# Patient Record
Sex: Female | Born: 1990 | Race: White | Hispanic: No | Marital: Married | Smoking: Never smoker
Health system: Southern US, Community
[De-identification: ages and names within clinical notes are randomized; demographics above are authoritative.]

## PROBLEM LIST (undated history)

## (undated) DIAGNOSIS — Z3483 Encounter for supervision of other normal pregnancy, third trimester: Secondary | ICD-10-CM

## (undated) DIAGNOSIS — Z789 Other specified health status: Secondary | ICD-10-CM

## (undated) HISTORY — DX: Other specified health status: Z78.9

## (undated) HISTORY — PX: WISDOM TOOTH EXTRACTION: SHX21

## (undated) HISTORY — PX: NO PAST SURGERIES: SHX2092

---
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
## 2010-01-30 ENCOUNTER — Other Ambulatory Visit
Admission: RE | Admit: 2010-01-30 | Discharge: 2010-01-30 | Payer: Self-pay | Source: Home / Self Care | Admitting: Family Medicine

## 2012-02-24 ENCOUNTER — Other Ambulatory Visit (HOSPITAL_COMMUNITY)
Admission: RE | Admit: 2012-02-24 | Discharge: 2012-02-24 | Disposition: A | Discharge status: Home / Self Care | Payer: 59 | Source: Ambulatory Visit | Attending: Family Medicine | Admitting: Family Medicine

## 2012-02-24 DIAGNOSIS — Z01419 Encounter for gynecological examination (general) (routine) without abnormal findings: Secondary | ICD-10-CM | POA: Insufficient documentation

## 2012-02-24 DIAGNOSIS — Z113 Encounter for screening for infections with a predominantly sexual mode of transmission: Secondary | ICD-10-CM | POA: Insufficient documentation

## 2015-05-09 ENCOUNTER — Other Ambulatory Visit: Payer: Self-pay | Admitting: Family Medicine

## 2015-05-09 ENCOUNTER — Other Ambulatory Visit (HOSPITAL_COMMUNITY)
Admission: RE | Admit: 2015-05-09 | Discharge: 2015-05-09 | Disposition: A | Discharge status: Home / Self Care | Payer: BLUE CROSS/BLUE SHIELD | Source: Ambulatory Visit | Attending: Family Medicine | Admitting: Family Medicine

## 2015-05-09 DIAGNOSIS — Z01419 Encounter for gynecological examination (general) (routine) without abnormal findings: Secondary | ICD-10-CM | POA: Diagnosis present

## 2015-05-10 LAB — CYTOLOGY - PAP

## 2016-09-03 LAB — OB RESULTS CONSOLE ABO/RH: RH Type: POSITIVE

## 2016-09-03 LAB — OB RESULTS CONSOLE ANTIBODY SCREEN: ANTIBODY SCREEN: NEGATIVE

## 2016-09-03 LAB — OB RESULTS CONSOLE RPR: RPR: NONREACTIVE

## 2016-09-03 LAB — OB RESULTS CONSOLE RUBELLA ANTIBODY, IGM: RUBELLA: NON-IMMUNE/NOT IMMUNE

## 2016-09-03 LAB — OB RESULTS CONSOLE HIV ANTIBODY (ROUTINE TESTING): HIV: NONREACTIVE

## 2016-09-03 LAB — OB RESULTS CONSOLE GC/CHLAMYDIA
CHLAMYDIA, DNA PROBE: NEGATIVE
GC PROBE AMP, GENITAL: NEGATIVE

## 2016-09-03 LAB — OB RESULTS CONSOLE HEPATITIS B SURFACE ANTIGEN: Hepatitis B Surface Ag: NEGATIVE

## 2017-03-04 LAB — OB RESULTS CONSOLE GBS: STREP GROUP B AG: NEGATIVE

## 2017-03-25 ENCOUNTER — Inpatient Hospital Stay (HOSPITAL_COMMUNITY): Admission: AD | Admit: 2017-03-25 | Payer: Self-pay | Source: Ambulatory Visit | Admitting: Obstetrics and Gynecology

## 2017-03-25 NOTE — L&D Delivery Note (Signed)
Delivery Note Pt pushed very well for 30-945min for delivery,  At 9:46 AM a viable and healthy female was delivered via  (Presentation: LOA; LOT  ).  APGAR: 9, 9; weight P .   Placenta status: delivered, intact .  Cord: 3V with the following complications: none.    Anesthesia:  epidural Episiotomy: None Lacerations: 2nd degree;Perineal Suture Repair: 3.0 vicryl rapide Est. Blood Loss (mL): 150cc  Mom to postpartum.  Baby to Couplet care / Skin to Skin.  Teresa Santana 04/04/2017, 10:07 AM  RI/A+/RI/Tdap in PNC/Contra ?

## 2017-03-28 ENCOUNTER — Telehealth (HOSPITAL_COMMUNITY): Payer: Self-pay | Admitting: *Deleted

## 2017-03-28 ENCOUNTER — Encounter (HOSPITAL_COMMUNITY): Payer: Self-pay | Admitting: *Deleted

## 2017-03-28 NOTE — Telephone Encounter (Signed)
Preadmission screen  

## 2017-04-03 ENCOUNTER — Inpatient Hospital Stay (HOSPITAL_COMMUNITY)
Admission: AD | Admit: 2017-04-03 | Discharge: 2017-04-05 | DRG: 807 | Disposition: A | Discharge status: Home / Self Care | Payer: BLUE CROSS/BLUE SHIELD | Source: Ambulatory Visit | Attending: Obstetrics and Gynecology | Admitting: Obstetrics and Gynecology

## 2017-04-03 DIAGNOSIS — Z3A40 40 weeks gestation of pregnancy: Secondary | ICD-10-CM

## 2017-04-04 ENCOUNTER — Inpatient Hospital Stay (HOSPITAL_COMMUNITY): Payer: BLUE CROSS/BLUE SHIELD | Admitting: Anesthesiology

## 2017-04-04 ENCOUNTER — Encounter (HOSPITAL_COMMUNITY): Payer: Self-pay | Admitting: *Deleted

## 2017-04-04 ENCOUNTER — Other Ambulatory Visit: Payer: Self-pay

## 2017-04-04 DIAGNOSIS — Z3A40 40 weeks gestation of pregnancy: Secondary | ICD-10-CM | POA: Diagnosis not present

## 2017-04-04 DIAGNOSIS — Z3483 Encounter for supervision of other normal pregnancy, third trimester: Secondary | ICD-10-CM | POA: Diagnosis present

## 2017-04-04 LAB — CBC
HCT: 33 % — ABNORMAL LOW (ref 36.0–46.0)
HEMOGLOBIN: 10.8 g/dL — AB (ref 12.0–15.0)
MCH: 26.7 pg (ref 26.0–34.0)
MCHC: 32.7 g/dL (ref 30.0–36.0)
MCV: 81.5 fL (ref 78.0–100.0)
PLATELETS: 269 10*3/uL (ref 150–400)
RBC: 4.05 MIL/uL (ref 3.87–5.11)
RDW: 14 % (ref 11.5–15.5)
WBC: 14.6 10*3/uL — ABNORMAL HIGH (ref 4.0–10.5)

## 2017-04-04 LAB — TYPE AND SCREEN
ABO/RH(D): A POS
ANTIBODY SCREEN: NEGATIVE

## 2017-04-04 LAB — ABO/RH: ABO/RH(D): A POS

## 2017-04-04 LAB — RPR: RPR Ser Ql: NONREACTIVE

## 2017-04-04 MED ORDER — TERBUTALINE SULFATE 1 MG/ML IJ SOLN
0.2500 mg | Freq: Once | INTRAMUSCULAR | Status: DC | PRN
  Filled 2017-04-04: qty 1

## 2017-04-04 MED ORDER — IBUPROFEN 600 MG PO TABS
600.0000 mg | ORAL_TABLET | Freq: Four times a day (QID) | ORAL | Status: DC
  Administered 2017-04-04 – 2017-04-05 (×4): 600 mg via ORAL
  Filled 2017-04-04 (×4): qty 1

## 2017-04-04 MED ORDER — OXYCODONE-ACETAMINOPHEN 5-325 MG PO TABS: 2.0000 | ORAL_TABLET | ORAL | Status: DC | PRN

## 2017-04-04 MED ORDER — EPHEDRINE 5 MG/ML INJ
10.0000 mg | INTRAVENOUS | Status: DC | PRN
  Filled 2017-04-04: qty 2

## 2017-04-04 MED ORDER — PHENYLEPHRINE 40 MCG/ML (10ML) SYRINGE FOR IV PUSH (FOR BLOOD PRESSURE SUPPORT)
80.0000 ug | PREFILLED_SYRINGE | INTRAVENOUS | Status: DC | PRN
  Filled 2017-04-04: qty 10
  Filled 2017-04-04: qty 5

## 2017-04-04 MED ORDER — ZOLPIDEM TARTRATE 5 MG PO TABS: 5.0000 mg | ORAL_TABLET | Freq: Every evening | ORAL | Status: DC | PRN

## 2017-04-04 MED ORDER — OXYTOCIN 40 UNITS IN LACTATED RINGERS INFUSION - SIMPLE MED
1.0000 m[IU]/min | INTRAVENOUS | Status: DC
  Administered 2017-04-04: 2 m[IU]/min via INTRAVENOUS
  Filled 2017-04-04: qty 1000

## 2017-04-04 MED ORDER — SENNOSIDES-DOCUSATE SODIUM 8.6-50 MG PO TABS
2.0000 | ORAL_TABLET | ORAL | Status: DC
  Administered 2017-04-05: 2 via ORAL
  Filled 2017-04-04: qty 2

## 2017-04-04 MED ORDER — LACTATED RINGERS IV SOLN: INTRAVENOUS | Status: DC

## 2017-04-04 MED ORDER — OXYCODONE-ACETAMINOPHEN 5-325 MG PO TABS: 1.0000 | ORAL_TABLET | ORAL | Status: DC | PRN

## 2017-04-04 MED ORDER — LIDOCAINE HCL (PF) 1 % IJ SOLN
INTRAMUSCULAR | Status: DC | PRN
  Administered 2017-04-04: 4 mL via EPIDURAL
  Administered 2017-04-04: 8 mL via EPIDURAL

## 2017-04-04 MED ORDER — FLEET ENEMA 7-19 GM/118ML RE ENEM: 1.0000 | ENEMA | RECTAL | Status: DC | PRN

## 2017-04-04 MED ORDER — COCONUT OIL OIL: 1.0000 "application " | TOPICAL_OIL | Status: DC | PRN

## 2017-04-04 MED ORDER — DIPHENHYDRAMINE HCL 25 MG PO CAPS: 25.0000 mg | ORAL_CAPSULE | Freq: Four times a day (QID) | ORAL | Status: DC | PRN

## 2017-04-04 MED ORDER — LACTATED RINGERS IV SOLN
500.0000 mL | Freq: Once | INTRAVENOUS | Status: AC
  Administered 2017-04-04: 500 mL via INTRAVENOUS

## 2017-04-04 MED ORDER — ONDANSETRON HCL 4 MG PO TABS: 4.0000 mg | ORAL_TABLET | ORAL | Status: DC | PRN

## 2017-04-04 MED ORDER — DIBUCAINE 1 % RE OINT: 1.0000 "application " | TOPICAL_OINTMENT | RECTAL | Status: DC | PRN

## 2017-04-04 MED ORDER — ACETAMINOPHEN 325 MG PO TABS: 650.0000 mg | ORAL_TABLET | ORAL | Status: DC | PRN

## 2017-04-04 MED ORDER — LACTATED RINGERS IV SOLN
INTRAVENOUS | Status: DC
  Administered 2017-04-04 (×2): via INTRAVENOUS

## 2017-04-04 MED ORDER — DIPHENHYDRAMINE HCL 50 MG/ML IJ SOLN: 12.5000 mg | INTRAMUSCULAR | Status: DC | PRN

## 2017-04-04 MED ORDER — OXYTOCIN BOLUS FROM INFUSION
500.0000 mL | Freq: Once | INTRAVENOUS | Status: AC
  Administered 2017-04-04: 500 mL via INTRAVENOUS

## 2017-04-04 MED ORDER — BUTORPHANOL TARTRATE 1 MG/ML IJ SOLN
1.0000 mg | INTRAMUSCULAR | Status: DC | PRN
  Administered 2017-04-04: 1 mg via INTRAVENOUS
  Filled 2017-04-04: qty 1

## 2017-04-04 MED ORDER — ONDANSETRON HCL 4 MG/2ML IJ SOLN
4.0000 mg | INTRAMUSCULAR | Status: DC | PRN
Start: 2017-04-04 — End: 2017-04-05

## 2017-04-04 MED ORDER — BENZOCAINE-MENTHOL 20-0.5 % EX AERO
1.0000 "application " | INHALATION_SPRAY | CUTANEOUS | Status: DC | PRN
  Administered 2017-04-04: 1 via TOPICAL
  Filled 2017-04-04: qty 56

## 2017-04-04 MED ORDER — PHENYLEPHRINE 40 MCG/ML (10ML) SYRINGE FOR IV PUSH (FOR BLOOD PRESSURE SUPPORT)
80.0000 ug | PREFILLED_SYRINGE | INTRAVENOUS | Status: DC | PRN
  Filled 2017-04-04: qty 5

## 2017-04-04 MED ORDER — LIDOCAINE HCL (PF) 1 % IJ SOLN
30.0000 mL | INTRAMUSCULAR | Status: DC | PRN
  Filled 2017-04-04: qty 30

## 2017-04-04 MED ORDER — ONDANSETRON HCL 4 MG/2ML IJ SOLN: 4.0000 mg | Freq: Four times a day (QID) | INTRAMUSCULAR | Status: DC | PRN

## 2017-04-04 MED ORDER — LACTATED RINGERS IV SOLN: 500.0000 mL | INTRAVENOUS | Status: DC | PRN

## 2017-04-04 MED ORDER — FENTANYL 2.5 MCG/ML BUPIVACAINE 1/10 % EPIDURAL INFUSION (WH - ANES)
14.0000 mL/h | INTRAMUSCULAR | Status: DC | PRN
  Administered 2017-04-04: 14 mL/h via EPIDURAL
  Filled 2017-04-04: qty 100

## 2017-04-04 MED ORDER — WITCH HAZEL-GLYCERIN EX PADS: 1.0000 "application " | MEDICATED_PAD | CUTANEOUS | Status: DC | PRN

## 2017-04-04 MED ORDER — SOD CITRATE-CITRIC ACID 500-334 MG/5ML PO SOLN: 30.0000 mL | ORAL | Status: DC | PRN

## 2017-04-04 MED ORDER — OXYTOCIN 40 UNITS IN LACTATED RINGERS INFUSION - SIMPLE MED: 2.5000 [IU]/h | INTRAVENOUS | Status: DC

## 2017-04-04 MED ORDER — PRENATAL MULTIVITAMIN CH
1.0000 | ORAL_TABLET | Freq: Every day | ORAL | Status: DC
  Administered 2017-04-05: 1 via ORAL
  Filled 2017-04-04: qty 1

## 2017-04-04 MED ORDER — SIMETHICONE 80 MG PO CHEW
80.0000 mg | CHEWABLE_TABLET | ORAL | Status: DC | PRN
Start: 2017-04-04 — End: 2017-04-05

## 2017-04-04 NOTE — Anesthesia Preprocedure Evaluation (Signed)

## 2017-04-04 NOTE — Anesthesia Postprocedure Evaluation (Signed)
Anesthesia Post Note  Patient: Arnetha GulaKatherine L Macek  Procedure(s) Performed: AN AD HOC LABOR EPIDURAL     Patient location during evaluation: Mother Baby Anesthesia Type: Epidural Level of consciousness: awake and alert Pain management: pain level controlled Vital Signs Assessment: post-procedure vital signs reviewed and stable Respiratory status: spontaneous breathing, nonlabored ventilation and respiratory function stable Cardiovascular status: stable Postop Assessment: no headache, no backache, epidural receding, no apparent nausea or vomiting, patient able to bend at knees and adequate PO intake Anesthetic complications: no    Last Vitals:  Vitals:   04/04/17 1130 04/04/17 1236  BP: (!) 107/57 (!) 112/50  Pulse:    Resp: 18   Temp: 36.7 C 36.8 C  SpO2:      Last Pain:  Vitals:   04/04/17 1236  TempSrc: Axillary  PainSc:    Pain Goal: Patients Stated Pain Goal: 2 (04/04/17 0436)               Laban EmperorMalinova,Eaden Hettinger Hristova

## 2017-04-04 NOTE — MAU Note (Signed)
Dr. Jackelyn KnifeMeisinger wants to watch her for one more hour if she feels up to it & recheck her at the end of that hour.

## 2017-04-04 NOTE — MAU Note (Addendum)
Took report from Caprice Renshawebra Callaway at this time. Pt just left to ambulate.

## 2017-04-04 NOTE — MAU Note (Signed)
PT SAYS UC STRONG SINCE 9PM.  VE IN OFFICE YESTERDAY 1 CM.   DENIES HSV AND MRSA.    GBS- NEG

## 2017-04-04 NOTE — Anesthesia Pain Management Evaluation Note (Signed)
  CRNA Pain Management Visit Note  Patient: Teresa Santana, 27 y.o., female  "Hello I am a [facility] the anesthesia [facility] at [facility]. We have an anesthesia team available at all times to provide care throughout the hospital, including epidural management and anesthesia for C-section. I don't know your plan for the delivery whether it a natural birth, water birth, IV sedation, nitrous supplementation, doula or epidural, but we want to meet your pain goals."   1.Was your pain managed to your expectations on prior hospitalizations?   No prior hospitalizations  2.What is your expectation for pain management during this hospitalization?     Epidural  3.How can we help you reach that goal?   Record the patient's initial score and the patient's pain goal.   Pain: 0  Pain Goal: 4 [facility] wants you to be able to say your pain was always managed very well.  Laban EmperorMalinova,Teresa Santana 04/04/2017

## 2017-04-04 NOTE — Anesthesia Procedure Notes (Signed)
Epidural Patient location during procedure: OB Start time: 04/04/2017 5:16 AM End time: 04/04/2017 5:19 AM  Staffing Anesthesiologist: Beryle LatheBrock, Thomas E, MD Performed: anesthesiologist   Preanesthetic Checklist Completed: patient identified, pre-op evaluation, timeout performed, IV checked, risks and benefits discussed and monitors and equipment checked  Epidural Patient position: sitting Prep: DuraPrep Patient monitoring: continuous pulse ox and blood pressure Approach: midline Location: L2-L3 Injection technique: LOR saline  Needle:  Needle type: Tuohy  Needle gauge: 17 G Needle length: 9 cm Needle insertion depth: 4 cm Catheter size: 19 Gauge Catheter at skin depth: 10 cm Test dose: negative and Other (1% lidocaine)  Additional Notes Patient identified. Risks including, but not limited to, bleeding, infection, nerve damage, paralysis, inadequate analgesia, blood pressure changes, nausea, vomiting, allergic reaction, postpartum back pain, itching, and headache were discussed. Patient expressed understanding and wished to proceed. Sterile prep and drape, including hand hygiene, mask, and sterile gloves were used. The patient was positioned and the spine was prepped. The skin was anesthetized with lidocaine. No paraesthesia or other complication noted. The patient did not experience any signs of intravascular injection such as tinnitus or metallic taste in mouth, nor signs of intrathecal spread such as rapid motor block. Please see nursing notes for vital signs. The patient tolerated the procedure well.   Leslye Peerhomas Brock, MDReason for block:procedure for pain

## 2017-04-04 NOTE — H&P (Signed)
Teresa Santana is a 27 y.o. female G1P0 at 44+ who presents in early labor.  Cervical change documented in MAU.  Relatively uncomplicated PNC.  Pt declined genetic screening.  [place] for S<D, but good growth.  Group B negative.     OB History    Gravida Para Term Preterm AB Living   1             SAB TAB Ectopic Multiple Live Births                no abn pap, no STDs G1 present  Past Medical History:  Diagnosis Date  . Medical history non-contributory    Past Surgical History:  Procedure Laterality Date  . NO PAST SURGERIES    . WISDOM TOOTH EXTRACTION     Family History: family history includes Cancer in her paternal grandmother.(lung cancer, leukemia) Social History:  reports that  has never smoked. she has never used smokeless tobacco. She reports that she does not drink alcohol or use drugs.married, works at [employer] PNV All NKDA     Maternal Diabetes: No Genetic Screening: Declined Maternal Ultrasounds/Referrals: Normal Fetal Ultrasounds or other Referrals:  None Maternal Substance Abuse:  No Significant Maternal Medications:  None Significant Maternal Lab Results:  Lab values include: Group B Strep negative Other Comments:  None  Review of Systems  Constitutional: Negative.   Eyes: Negative.   Respiratory: Negative.   Cardiovascular: Negative.   Gastrointestinal: Negative.   Genitourinary: Negative.   Musculoskeletal: Positive for back pain.  Skin: Negative.   Neurological: Negative.   Psychiatric/Behavioral: Negative.    Maternal Medical History:  Reason for admission: Contractions.   Contractions: Onset was 6-12 hours ago.   Frequency: regular.   Perceived severity is moderate.    Fetal activity: Perceived fetal activity is normal.    Prenatal Complications - Diabetes: none.    Dilation: 6.5 Effacement (%): 90 Station: 0 Exam by:: e. poore, rnc Blood pressure (!) 113/57, pulse 70, temperature (!) 97.3 F (36.3 C),  temperature source Oral, resp. rate 20, height 5\' 7"  (1.702 m), weight 75.5 kg (166 lb 8 oz), last menstrual period 06/27/2016, SpO2 100 %. Maternal Exam:  Uterine Assessment: Contraction frequency is regular.   Abdomen: Patient reports no abdominal tenderness. Fundal height is app for gestation.   Estimated fetal weight is 7-8#.   Fetal presentation: vertex  Introitus: Normal vulva. Normal vagina.  Cervix: Cervix evaluated by digital exam.     Physical Exam  Constitutional: She is oriented to person, place, and time. She appears well-developed and well-nourished.  HENT:  Head: Normocephalic and atraumatic.  Cardiovascular: Normal rate and regular rhythm.  Respiratory: Effort normal and breath sounds normal. No respiratory distress. She has no wheezes.  GI: Soft. Bowel sounds are normal. She exhibits no distension. There is no tenderness.  Musculoskeletal: Normal range of motion.  Neurological: She is alert and oriented to person, place, and time.  Skin: Skin is warm and dry.  Psychiatric: She has a normal mood and affect. Her behavior is normal.    Prenatal labs: ABO, Rh: --/--/A POS (01/11 0427) Antibody: NEG (01/11 0427) Rubella: Nonimmune (06/12 0000) RPR: Nonreactive (06/12 0000)  HBsAg: Negative (06/12 0000)  HIV: Non-reactive (06/12 0000)  GBS: Negative (12/11 0000)   Hgb 12.9/Plt 356/GC  Neg/Chl neg/Varicella immune/Hgb electro WNL/US dates cwd from LMP/  Nl anat, ant plac, female Good growth  Assessment/Plan: 26yo G1P0 at 34+ in labor, getting gactive Expect  SVD Epidural for comfort gbbs neg Augmentation prn.     Alhaji Mcneal Bovard-Stuckert 04/04/2017, 8:00 AM

## 2017-04-05 ENCOUNTER — Inpatient Hospital Stay (HOSPITAL_COMMUNITY): Admission: RE | Admit: 2017-04-05 | Payer: BLUE CROSS/BLUE SHIELD | Source: Ambulatory Visit

## 2017-04-05 LAB — CBC
HCT: 28.8 % — ABNORMAL LOW (ref 36.0–46.0)
HEMOGLOBIN: 9.4 g/dL — AB (ref 12.0–15.0)
MCH: 26.9 pg (ref 26.0–34.0)
MCHC: 32.6 g/dL (ref 30.0–36.0)
MCV: 82.5 fL (ref 78.0–100.0)
PLATELETS: 223 10*3/uL (ref 150–400)
RBC: 3.49 MIL/uL — AB (ref 3.87–5.11)
RDW: 14.2 % (ref 11.5–15.5)
WBC: 12 10*3/uL — ABNORMAL HIGH (ref 4.0–10.5)

## 2017-04-05 MED ORDER — IBUPROFEN 600 MG PO TABS: 600.0000 mg | ORAL_TABLET | Freq: Four times a day (QID) | ORAL | 1 refills | Status: DC | PRN

## 2017-04-05 MED ORDER — PRENATAL MULTIVITAMIN CH: 1.0000 | ORAL_TABLET | Freq: Every day | ORAL | 3 refills | Status: AC

## 2017-04-05 MED ORDER — MEASLES, MUMPS & RUBELLA VAC ~~LOC~~ INJ
0.5000 mL | INJECTION | Freq: Once | SUBCUTANEOUS | Status: AC
  Administered 2017-04-05: 0.5 mL via SUBCUTANEOUS
  Filled 2017-04-05: qty 0.5

## 2017-04-05 NOTE — Progress Notes (Addendum)
Post Partum Day 1 Subjective: no complaints, up ad lib, voiding, tolerating PO and nl lochia, pain controoled  Objective: Blood pressure (!) 109/51, pulse 62, temperature 98.3 F (36.8 C), temperature source Oral, resp. rate 18, height 5\' 7"  (1.702 m), weight 75.5 kg (166 lb 8 oz), last menstrual period 06/27/2016, SpO2 100 %, unknown if currently breastfeeding.  Physical Exam:  General: alert and no distress Lochia: appropriate Uterine Fundus: firm   Recent Labs    04/04/17 0427 04/05/17 0532  HGB 10.8* 9.4*  HCT 33.0* 28.8*    Assessment/Plan: Plan for discharge tomorrow, routine PP care.  Pt requests early discharge.  D/C with motrin and PNV.  F/u 6 weeks   LOS: 1 day   Tomicka Lover Bovard-Stuckert 04/05/2017, 8:18 AM

## 2017-04-05 NOTE — Discharge Summary (Signed)
OB Discharge Summary     Patient Name: Teresa Santana DOB: 06/03/1990 MRN: [id number]  Date of admission: 04/03/2017 Delivering MD: Sherian Rein   Date of discharge: 04/05/2017  Admitting diagnosis: 40 WEEKS CTX Intrauterine pregnancy: [redacted]w[redacted]d     Secondary diagnosis:  Principal Problem:   SVD (spontaneous vaginal delivery) Active Problems:   Indication for care in labor or delivery  Additional problems: N/A     Discharge diagnosis: Term Pregnancy Delivered                                                                                                Post partum procedures:N/A  Augmentation: Pitocin  Complications: None  Hospital course:  Onset of Labor With Vaginal Delivery     27 y.o. yo G1P1001 at [redacted]w[redacted]d was admitted in Active Labor on 04/03/2017. Patient had an uncomplicated labor course as follows:  Membrane Rupture Time/Date: 7:16 AM ,04/04/2017   Intrapartum Procedures: Episiotomy: None [1]                                         Lacerations:  2nd degree [3];Perineal [11]  Patient had a delivery of a Viable infant. 04/04/2017  Information for the patient's newborn:  Elexius, Minar [[id number]]  Delivery Method: Vaginal, Spontaneous(Filed from Delivery Summary)    Pateint had an uncomplicated postpartum course.  She is ambulating, tolerating a regular diet, passing flatus, and urinating well. Patient is discharged home in stable condition on 04/05/17.   Physical exam  Vitals:   04/04/17 1130 04/04/17 1236 04/04/17 1705 04/05/17 0059  BP: (!) 107/57 (!) 112/50 (!) 116/46 (!) 109/51  Pulse:   75 62  Resp: 18  20 18   Temp: 98.1 F (36.7 C) 98.3 F (36.8 C) 98.2 F (36.8 C) 98.3 F (36.8 C)  TempSrc: Axillary Axillary Oral Oral  SpO2:      Weight:      Height:       General: alert and no distress Lochia: appropriate Uterine Fundus: firm  Labs: Lab Results  Component Value Date   WBC 12.0 (H) 04/05/2017   HGB 9.4 (L) 04/05/2017   HCT 28.8 (L) 04/05/2017   MCV 82.5 04/05/2017   PLT 223 04/05/2017   No flowsheet data found.  Discharge instruction: per After Visit Summary and "Baby and Me Booklet".  After visit meds:  Allergies as of 04/05/2017   No Known Allergies     Medication List    TAKE these medications   ibuprofen 600 MG tablet Commonly known as:  ADVIL,MOTRIN Take 1 tablet (600 mg total) by mouth every 6 (six) hours as needed.   prenatal multivitamin Tabs tablet Take 1 tablet by mouth daily at 12 noon.       Diet: routine diet  Activity: Advance as tolerated. Pelvic rest for 6 weeks.   Outpatient follow up:6 weeks Follow up Appt:No future appointments. Follow up Visit:No Follow-up on file.  Postpartum contraception: Undecided  Newborn Data: Live born female  Birth  Weight: 7 lb 7.4 oz (3385 g) APGAR: 9, 9  Newborn Delivery   Birth date/time:  04/04/2017 09:46:00 Delivery type:  Vaginal, Spontaneous     Baby Feeding: Breast Disposition:home with mother   04/05/2017 Sherian ReinJody Bovard-Stuckert, MD

## 2017-12-18 DIAGNOSIS — Z23 Encounter for immunization: Secondary | ICD-10-CM | POA: Diagnosis not present

## 2018-03-25 NOTE — L&D Delivery Note (Signed)
Delivery Note With 3-4 excellent pushes had delivery.  At 11:26 AM a viable and healthy female was delivered via Vaginal, Spontaneous (Presentation: OA; ROT ).  APGAR: 9,9 ; weight P .   Placenta status: delivered, intact .  Cord: 3V with the following complications: none.   Anesthesia: epidural  Episiotomy: None Lacerations: 2nd degree;Perineal Suture Repair: 3.0 vicryl rapide Est. Blood Loss (mL): 142  Mom to postpartum.  Baby to Couplet care / Skin to Skin.  Luzelena Heeg Bovard-Stuckert 11/23/2018, 11:50 AM  Bo/RI/Tdap in PNC/A+/Conta?  D/W pt and FOB r/b/a of circumcision for female infant, will proceed

## 2018-04-07 DIAGNOSIS — O26891 Other specified pregnancy related conditions, first trimester: Secondary | ICD-10-CM | POA: Diagnosis not present

## 2018-04-07 DIAGNOSIS — N911 Secondary amenorrhea: Secondary | ICD-10-CM | POA: Diagnosis not present

## 2018-04-07 DIAGNOSIS — Z3A09 9 weeks gestation of pregnancy: Secondary | ICD-10-CM | POA: Diagnosis not present

## 2018-04-07 DIAGNOSIS — Z3201 Encounter for pregnancy test, result positive: Secondary | ICD-10-CM | POA: Diagnosis not present

## 2018-04-29 DIAGNOSIS — Z3481 Encounter for supervision of other normal pregnancy, first trimester: Secondary | ICD-10-CM | POA: Diagnosis not present

## 2018-04-29 DIAGNOSIS — Z3A12 12 weeks gestation of pregnancy: Secondary | ICD-10-CM | POA: Diagnosis not present

## 2018-06-04 DIAGNOSIS — L03032 Cellulitis of left toe: Secondary | ICD-10-CM | POA: Diagnosis not present

## 2018-07-01 DIAGNOSIS — Z3A19 19 weeks gestation of pregnancy: Secondary | ICD-10-CM | POA: Diagnosis not present

## 2018-07-15 DIAGNOSIS — L03032 Cellulitis of left toe: Secondary | ICD-10-CM | POA: Diagnosis not present

## 2018-07-15 DIAGNOSIS — M79674 Pain in right toe(s): Secondary | ICD-10-CM | POA: Diagnosis not present

## 2018-07-15 DIAGNOSIS — M79675 Pain in left toe(s): Secondary | ICD-10-CM | POA: Diagnosis not present

## 2018-07-15 DIAGNOSIS — L600 Ingrowing nail: Secondary | ICD-10-CM | POA: Diagnosis not present

## 2018-07-29 DIAGNOSIS — M79675 Pain in left toe(s): Secondary | ICD-10-CM | POA: Diagnosis not present

## 2018-07-29 DIAGNOSIS — M79674 Pain in right toe(s): Secondary | ICD-10-CM | POA: Diagnosis not present

## 2018-07-29 DIAGNOSIS — L600 Ingrowing nail: Secondary | ICD-10-CM | POA: Diagnosis not present

## 2018-08-05 DIAGNOSIS — Z3A24 24 weeks gestation of pregnancy: Secondary | ICD-10-CM | POA: Diagnosis not present

## 2018-08-13 DIAGNOSIS — L600 Ingrowing nail: Secondary | ICD-10-CM | POA: Diagnosis not present

## 2018-11-22 ENCOUNTER — Encounter (HOSPITAL_COMMUNITY): Payer: Self-pay

## 2018-11-22 ENCOUNTER — Inpatient Hospital Stay (HOSPITAL_COMMUNITY): Payer: 59

## 2018-11-22 ENCOUNTER — Encounter (HOSPITAL_COMMUNITY): Payer: Self-pay | Admitting: Obstetrics and Gynecology

## 2018-11-22 DIAGNOSIS — Z3483 Encounter for supervision of other normal pregnancy, third trimester: Secondary | ICD-10-CM

## 2018-11-22 HISTORY — DX: Encounter for supervision of other normal pregnancy, third trimester: Z34.83

## 2018-11-22 NOTE — H&P (Signed)
Marianna FussKatherine Lorraine Edds is a 28 y.o. female G2P1001 at 39+ for IOL given term status and multiparous.  Pregnancy complicated by fetal pyelectasis, resolved on US.  Pregnancy dated by 7 wk US.  +FM, no LOF, no VB, occ ctx.  Declined genetic screening.  Received tdap 08/25/18.  gbbs neg.  D/W pt r/b/a and process of IOL.  Wishes to proceed.     OB History    Gravida  1   Para  1   Term  1   Preterm      AB      Living  1     SAB      TAB      Ectopic      Multiple  0   Live Births  1         G1 term  SVD, female 1/19, 7#7 G2 present  No abn pap No STD  Past Medical History:  Diagnosis Date  . Medical history non-contributory   . Normal pregnancy in multigravida in third trimester 11/22/2018  . SVD (spontaneous vaginal delivery) 04/04/2017   Past Surgical History:  Procedure Laterality Date  . NO PAST SURGERIES    . WISDOM TOOTH EXTRACTION     Family History: family history includes Cancer in her paternal grandmother.leukemia and ALS Social History:  reports that she has never smoked. She has never used smokeless tobacco. She reports that she does not drink alcohol or use drugs. SAHM, married  Meds PNV All NKDA     Maternal Diabetes: No Genetic Screening: Declined Maternal Ultrasounds/Referrals: Fetal renal pyelectasis, resolved Fetal Ultrasounds or other Referrals:  None Maternal Substance Abuse:  No Significant Maternal Medications:  None Significant Maternal Lab Results:  Group B Strep negative Other Comments:  None  Review of Systems  Constitutional: Negative.   HENT: Negative.   Eyes: Negative.   Respiratory: Negative.   Cardiovascular: Negative.   Gastrointestinal: Negative.   Genitourinary: Negative.   Musculoskeletal: Negative.   Skin: Negative.   Neurological: Negative.   Psychiatric/Behavioral: Negative.    Maternal Medical History:  Contractions: Frequency: irregular.    Fetal activity: Perceived fetal activity is normal.     Prenatal Complications - Diabetes: none.      unknown if currently breastfeeding. Maternal Exam:  Uterine Assessment: Contraction frequency is regular.   Abdomen: Fundal height is appropriate for gestation.   Estimated fetal weight is 7.5-8.5#.   Fetal presentation: vertex  Introitus: Normal vulva. Normal vagina.    Physical Exam  Constitutional: She is oriented to person, place, and time. She appears well-developed and well-nourished.  HENT:  Head: Normocephalic and atraumatic.  Cardiovascular: Normal rate and regular rhythm.  Respiratory: Effort normal and breath sounds normal. No respiratory distress. She has no wheezes.  GI: Soft. Bowel sounds are normal. She exhibits no distension. There is no abdominal tenderness.  Genitourinary:    Vulva normal.   Musculoskeletal: Normal range of motion.  Neurological: She is alert and oriented to person, place, and time.  Skin: Skin is warm and dry.  Psychiatric: She has a normal mood and affect. Her behavior is normal.    Prenatal labs: ABO, Rh:  A+ Antibody:  neg Rubella:  immune RPR:   NR HBsAg:   neg HIV:   neg GBS:   neg  Hgb 13.6/Plt 262/Ur Cx neg/GC neg/Chl neg/Varicella immune/Hgb electro WNL/glucola 108/  Dated 7wk US US - nl anat but limited, ant plac, female F/u completes nl anat but fetal pyelectasis 32week  pyelectasis resolved  Tdap 6/2 HOSPITAL CIRC PAID  Assessment/Plan: 28yo G2P1001 at 39+ for IOL GBBS neg - no prophylaxis IOL w alternating po and pv cytotec, then AROM/pitocin Epidural or IV pain meds prn Expect SVD Hospital circ paid  Dot Splinter Bovard-Stuckert 11/22/2018, 10:23 PM

## 2018-11-23 ENCOUNTER — Inpatient Hospital Stay (HOSPITAL_COMMUNITY): Payer: 59 | Admitting: Anesthesiology

## 2018-11-23 ENCOUNTER — Inpatient Hospital Stay (HOSPITAL_COMMUNITY)
Admission: AD | Admit: 2018-11-23 | Discharge: 2018-11-24 | DRG: 807 | Disposition: A | Discharge status: Home / Self Care | Payer: 59 | Attending: Obstetrics and Gynecology | Admitting: Obstetrics and Gynecology

## 2018-11-23 ENCOUNTER — Other Ambulatory Visit: Payer: Self-pay

## 2018-11-23 ENCOUNTER — Encounter (HOSPITAL_COMMUNITY): Payer: Self-pay

## 2018-11-23 DIAGNOSIS — D649 Anemia, unspecified: Secondary | ICD-10-CM | POA: Diagnosis present

## 2018-11-23 DIAGNOSIS — O9902 Anemia complicating childbirth: Principal | ICD-10-CM | POA: Diagnosis present

## 2018-11-23 DIAGNOSIS — Z3A39 39 weeks gestation of pregnancy: Secondary | ICD-10-CM

## 2018-11-23 DIAGNOSIS — O26893 Other specified pregnancy related conditions, third trimester: Secondary | ICD-10-CM | POA: Diagnosis present

## 2018-11-23 DIAGNOSIS — Z3483 Encounter for supervision of other normal pregnancy, third trimester: Secondary | ICD-10-CM

## 2018-11-23 LAB — CBC
HCT: 32.7 % — ABNORMAL LOW (ref 36.0–46.0)
Hemoglobin: 10.6 g/dL — ABNORMAL LOW (ref 12.0–15.0)
MCH: 25.4 pg — ABNORMAL LOW (ref 26.0–34.0)
MCHC: 32.4 g/dL (ref 30.0–36.0)
MCV: 78.4 fL — ABNORMAL LOW (ref 80.0–100.0)
Platelets: 283 10*3/uL (ref 150–400)
RBC: 4.17 MIL/uL (ref 3.87–5.11)
RDW: 14.2 % (ref 11.5–15.5)
WBC: 11.1 10*3/uL — ABNORMAL HIGH (ref 4.0–10.5)
nRBC: 0 % (ref 0.0–0.2)

## 2018-11-23 LAB — ABO/RH: ABO/RH(D): A POS

## 2018-11-23 LAB — SARS CORONAVIRUS 2 BY RT PCR (HOSPITAL ORDER, PERFORMED IN ~~LOC~~ HOSPITAL LAB): SARS Coronavirus 2: NEGATIVE

## 2018-11-23 LAB — TYPE AND SCREEN
ABO/RH(D): A POS
Antibody Screen: NEGATIVE

## 2018-11-23 LAB — RPR: RPR Ser Ql: NONREACTIVE

## 2018-11-23 MED ORDER — FENTANYL-BUPIVACAINE-NACL 0.5-0.125-0.9 MG/250ML-% EP SOLN: 12.0000 mL/h | EPIDURAL | Status: DC | PRN

## 2018-11-23 MED ORDER — PHENYLEPHRINE 40 MCG/ML (10ML) SYRINGE FOR IV PUSH (FOR BLOOD PRESSURE SUPPORT): 80.0000 ug | PREFILLED_SYRINGE | INTRAVENOUS | Status: DC | PRN

## 2018-11-23 MED ORDER — SIMETHICONE 80 MG PO CHEW: 80.0000 mg | CHEWABLE_TABLET | ORAL | Status: DC | PRN

## 2018-11-23 MED ORDER — PRENATAL MULTIVITAMIN CH
1.0000 | ORAL_TABLET | Freq: Every day | ORAL | Status: DC
  Administered 2018-11-24: 1 via ORAL
  Filled 2018-11-23: qty 1

## 2018-11-23 MED ORDER — TERBUTALINE SULFATE 1 MG/ML IJ SOLN: 0.2500 mg | Freq: Once | INTRAMUSCULAR | Status: DC | PRN

## 2018-11-23 MED ORDER — LIDOCAINE HCL (PF) 1 % IJ SOLN
INTRAMUSCULAR | Status: DC | PRN
  Administered 2018-11-23: 5 mL via EPIDURAL

## 2018-11-23 MED ORDER — SODIUM CHLORIDE (PF) 0.9 % IJ SOLN
INTRAMUSCULAR | Status: DC | PRN
  Administered 2018-11-23: 12 mL/h via EPIDURAL

## 2018-11-23 MED ORDER — SENNOSIDES-DOCUSATE SODIUM 8.6-50 MG PO TABS
2.0000 | ORAL_TABLET | ORAL | Status: DC
  Administered 2018-11-23: 22:00:00 2 via ORAL
  Filled 2018-11-23: qty 2

## 2018-11-23 MED ORDER — COCONUT OIL OIL: 1.0000 "application " | TOPICAL_OIL | Status: DC | PRN

## 2018-11-23 MED ORDER — IBUPROFEN 600 MG PO TABS
600.0000 mg | ORAL_TABLET | Freq: Four times a day (QID) | ORAL | Status: DC
  Administered 2018-11-23 – 2018-11-24 (×4): 600 mg via ORAL
  Filled 2018-11-23 (×4): qty 1

## 2018-11-23 MED ORDER — OXYTOCIN BOLUS FROM INFUSION
500.0000 mL | Freq: Once | INTRAVENOUS | Status: AC
  Administered 2018-11-23: 500 mL via INTRAVENOUS

## 2018-11-23 MED ORDER — OXYTOCIN 40 UNITS IN NORMAL SALINE INFUSION - SIMPLE MED
1.0000 m[IU]/min | INTRAVENOUS | Status: DC
  Administered 2018-11-23: 06:00:00 2 m[IU]/min via INTRAVENOUS
  Filled 2018-11-23: qty 1000

## 2018-11-23 MED ORDER — OXYCODONE HCL 5 MG PO TABS: 5.0000 mg | ORAL_TABLET | ORAL | Status: DC | PRN

## 2018-11-23 MED ORDER — OXYCODONE-ACETAMINOPHEN 5-325 MG PO TABS: 2.0000 | ORAL_TABLET | ORAL | Status: DC | PRN

## 2018-11-23 MED ORDER — ZOLPIDEM TARTRATE 5 MG PO TABS: 5.0000 mg | ORAL_TABLET | Freq: Every evening | ORAL | Status: DC | PRN

## 2018-11-23 MED ORDER — ONDANSETRON HCL 4 MG/2ML IJ SOLN: 4.0000 mg | INTRAMUSCULAR | Status: DC | PRN

## 2018-11-23 MED ORDER — EPHEDRINE 5 MG/ML INJ: 10.0000 mg | INTRAVENOUS | Status: DC | PRN

## 2018-11-23 MED ORDER — MISOPROSTOL 50MCG HALF TABLET
50.0000 ug | ORAL_TABLET | Freq: Three times a day (TID) | ORAL | Status: DC
  Administered 2018-11-23: 01:00:00 50 ug via ORAL
  Filled 2018-11-23: qty 1

## 2018-11-23 MED ORDER — MISOPROSTOL 25 MCG QUARTER TABLET
25.0000 ug | ORAL_TABLET | Freq: Three times a day (TID) | ORAL | Status: DC
  Filled 2018-11-23 (×2): qty 1

## 2018-11-23 MED ORDER — DIPHENHYDRAMINE HCL 50 MG/ML IJ SOLN: 12.5000 mg | INTRAMUSCULAR | Status: DC | PRN

## 2018-11-23 MED ORDER — LACTATED RINGERS IV SOLN: 500.0000 mL | INTRAVENOUS | Status: DC | PRN

## 2018-11-23 MED ORDER — LACTATED RINGERS IV SOLN
INTRAVENOUS | Status: DC
  Administered 2018-11-23: 01:00:00 via INTRAVENOUS

## 2018-11-23 MED ORDER — ONDANSETRON HCL 4 MG/2ML IJ SOLN
4.0000 mg | Freq: Four times a day (QID) | INTRAMUSCULAR | Status: DC | PRN
  Administered 2018-11-23 (×2): 4 mg via INTRAVENOUS
  Filled 2018-11-23 (×2): qty 2

## 2018-11-23 MED ORDER — DIBUCAINE (PERIANAL) 1 % EX OINT: 1.0000 "application " | TOPICAL_OINTMENT | CUTANEOUS | Status: DC | PRN

## 2018-11-23 MED ORDER — LACTATED RINGERS IV SOLN: INTRAVENOUS | Status: DC

## 2018-11-23 MED ORDER — BENZOCAINE-MENTHOL 20-0.5 % EX AERO
1.0000 "application " | INHALATION_SPRAY | CUTANEOUS | Status: DC | PRN
  Filled 2018-11-23: qty 56

## 2018-11-23 MED ORDER — ACETAMINOPHEN 325 MG PO TABS
650.0000 mg | ORAL_TABLET | ORAL | Status: DC | PRN
  Administered 2018-11-23 – 2018-11-24 (×3): 650 mg via ORAL
  Filled 2018-11-23 (×3): qty 2

## 2018-11-23 MED ORDER — ACETAMINOPHEN 325 MG PO TABS: 650.0000 mg | ORAL_TABLET | ORAL | Status: DC | PRN

## 2018-11-23 MED ORDER — SOD CITRATE-CITRIC ACID 500-334 MG/5ML PO SOLN: 30.0000 mL | ORAL | Status: DC | PRN

## 2018-11-23 MED ORDER — LACTATED RINGERS IV SOLN: 500.0000 mL | Freq: Once | INTRAVENOUS | Status: DC

## 2018-11-23 MED ORDER — OXYCODONE HCL 5 MG PO TABS: 10.0000 mg | ORAL_TABLET | ORAL | Status: DC | PRN

## 2018-11-23 MED ORDER — MISOPROSTOL 25 MCG QUARTER TABLET: 25.0000 ug | ORAL_TABLET | Freq: Three times a day (TID) | ORAL | Status: DC | PRN

## 2018-11-23 MED ORDER — DIPHENHYDRAMINE HCL 25 MG PO CAPS: 25.0000 mg | ORAL_CAPSULE | Freq: Four times a day (QID) | ORAL | Status: DC | PRN

## 2018-11-23 MED ORDER — OXYTOCIN 40 UNITS IN NORMAL SALINE INFUSION - SIMPLE MED
2.5000 [IU]/h | INTRAVENOUS | Status: DC
  Administered 2018-11-23: 2.5 [IU]/h via INTRAVENOUS

## 2018-11-23 MED ORDER — ONDANSETRON HCL 4 MG PO TABS: 4.0000 mg | ORAL_TABLET | ORAL | Status: DC | PRN

## 2018-11-23 MED ORDER — FENTANYL-BUPIVACAINE-NACL 0.5-0.125-0.9 MG/250ML-% EP SOLN
12.0000 mL/h | EPIDURAL | Status: DC | PRN
  Filled 2018-11-23: qty 250

## 2018-11-23 MED ORDER — LIDOCAINE HCL (PF) 1 % IJ SOLN: 30.0000 mL | INTRAMUSCULAR | Status: DC | PRN

## 2018-11-23 MED ORDER — BUTORPHANOL TARTRATE 1 MG/ML IJ SOLN
1.0000 mg | INTRAMUSCULAR | Status: DC | PRN
  Administered 2018-11-23 (×2): 1 mg via INTRAVENOUS
  Filled 2018-11-23 (×2): qty 1

## 2018-11-23 MED ORDER — WITCH HAZEL-GLYCERIN EX PADS: 1.0000 "application " | MEDICATED_PAD | CUTANEOUS | Status: DC | PRN

## 2018-11-23 MED ORDER — OXYCODONE-ACETAMINOPHEN 5-325 MG PO TABS: 1.0000 | ORAL_TABLET | ORAL | Status: DC | PRN

## 2018-11-23 MED ORDER — MISOPROSTOL 50MCG HALF TABLET: 50.0000 ug | ORAL_TABLET | Freq: Three times a day (TID) | ORAL | Status: DC | PRN

## 2018-11-23 NOTE — Anesthesia Postprocedure Evaluation (Signed)
Anesthesia Post Note  Patient: Teresa Santana  Procedure(s) Performed: AN AD [place]     Patient location during evaluation: Mother Baby Anesthesia Type: Epidural Level of consciousness: awake and alert Pain management: pain level controlled Vital Signs Assessment: post-procedure vital signs reviewed and stable Respiratory status: spontaneous breathing, nonlabored ventilation and respiratory function stable Cardiovascular status: stable Postop Assessment: no headache, no backache and epidural receding Anesthetic complications: no    Last Vitals:  Vitals:   11/23/18 1350 11/23/18 1500  BP: 118/62 (!) 105/50  Pulse: 64 68  Resp: 18 16  Temp: 37.3 C 36.9 C  SpO2:      Last Pain:  Vitals:   11/23/18 1500  TempSrc: Oral  PainSc: 0-No pain   Pain Goal:                   Drucie Opitz

## 2018-11-23 NOTE — Anesthesia Preprocedure Evaluation (Signed)
Anesthesia Evaluation  Patient identified by MRN, date of birth, ID band Patient awake    Reviewed: Allergy & Precautions, NPO status , Patient's Chart, lab work & pertinent test results  Airway Mallampati: II  TM Distance: >3 FB Neck ROM: Full    Dental no notable dental hx.    Pulmonary neg pulmonary ROS,    Pulmonary exam normal breath sounds clear to auscultation       Cardiovascular Exercise Tolerance: Good negative cardio ROS Normal cardiovascular exam Rhythm:Regular Rate:Normal     Neuro/Psych negative neurological ROS  negative psych ROS   GI/Hepatic   Endo/Other    Renal/GU      Musculoskeletal   Abdominal   Peds  Hematology Hgb 10.6 Plt 283   Anesthesia Other Findings   Reproductive/Obstetrics (+) Pregnancy                             Anesthesia Physical Anesthesia Plan  ASA: II  Anesthesia Plan: Epidural   Post-op Pain Management:    Induction:   PONV Risk Score and Plan:   Airway Management Planned:   Additional Equipment:   Intra-op Plan:   Post-operative Plan:   Informed Consent: I have reviewed the patients History and Physical, chart, labs and discussed the procedure including the risks, benefits and alternatives for the proposed anesthesia with the patient or authorized representative who has indicated his/her understanding and acceptance.       Plan Discussed with:   Anesthesia Plan Comments: (39wk G2P1 for LEA)        Anesthesia Quick Evaluation

## 2018-11-23 NOTE — Anesthesia Procedure Notes (Signed)
Epidural Patient location during procedure: OB Start time: 11/23/2018 7:17 AM  Staffing Anesthesiologist: Barnet Glasgow, MD Performed: anesthesiologist   Preanesthetic Checklist Completed: patient identified, site marked, surgical consent, pre-op evaluation, timeout performed, IV checked, risks and benefits discussed and monitors and equipment checked  Epidural Patient position: sitting Prep: site prepped and draped and DuraPrep Patient monitoring: continuous pulse ox and blood pressure Approach: midline Location: L3-L4 Injection technique: LOR air  Needle:  Needle type: Tuohy  Needle gauge: 17 G Needle length: 9 cm and 9 Needle insertion depth: 6 cm Catheter type: closed end flexible Catheter size: 19 Gauge Catheter at skin depth: 11 cm Test dose: negative  Assessment Events: blood not aspirated, injection not painful, no injection resistance, negative IV test and no paresthesia  Additional Notes Patient identified. Risks/Benefits/Options discussed with patient including but not limited to bleeding, infection, nerve damage, paralysis, failed block, incomplete pain control, headache, blood pressure changes, nausea, vomiting, reactions to medication both or allergic, itching and postpartum back pain. Confirmed with bedside nurse the patient's most recent platelet count. Confirmed with patient that they are not currently taking any anticoagulation, have any bleeding history or any family history of bleeding disorders. Patient expressed understanding and wished to proceed. All questions were answered. Sterile technique was used throughout the entire procedure. Please see nursing notes for vital signs. Test dose was given through epidural needle and negative prior to continuing to dose epidural or start infusion. Warning signs of high block given to the patient including shortness of breath, tingling/numbness in hands, complete motor block, or any concerning symptoms with instructions  to call for help. Patient was given instructions on fall risk and not to get out of bed. All questions and concerns addressed with instructions to call with any issues. 1 Attempt (S) . Patient tolerated procedure well.

## 2018-11-23 NOTE — Progress Notes (Signed)
Patient ID: Teresa Santana, female   DOB: 01-31-1991, 28 y.o.   MRN: [id number]  Elective IOL  H&P reviewed, no changes  AFVSS gen NAD  FHTs 120's, mod var, + accels, occ variable; category 1-2 toco q 2 min  4-5/90/0  ?AROM - min fluid  Continue IOL 44mU pitocin

## 2018-11-24 LAB — CBC
HCT: 27.9 % — ABNORMAL LOW (ref 36.0–46.0)
Hemoglobin: 8.4 g/dL — ABNORMAL LOW (ref 12.0–15.0)
MCH: 24.4 pg — ABNORMAL LOW (ref 26.0–34.0)
MCHC: 30.1 g/dL (ref 30.0–36.0)
MCV: 81.1 fL (ref 80.0–100.0)
Platelets: 225 10*3/uL (ref 150–400)
RBC: 3.44 MIL/uL — ABNORMAL LOW (ref 3.87–5.11)
RDW: 14.5 % (ref 11.5–15.5)
WBC: 10.5 10*3/uL (ref 4.0–10.5)
nRBC: 0 % (ref 0.0–0.2)

## 2018-11-24 MED ORDER — IBUPROFEN 600 MG PO TABS: 600.0000 mg | ORAL_TABLET | Freq: Four times a day (QID) | ORAL | 1 refills | Status: AC | PRN

## 2018-11-24 MED ORDER — FERROUS FUMARATE 63 (20 FE) MG PO TABS: 1.0000 | ORAL_TABLET | ORAL | 0 refills | Status: AC

## 2018-11-24 NOTE — Discharge Summary (Signed)
OB Discharge Summary     Patient Name: Teresa Santana DOB: [birth year] MRN: [id number]  Date of admission: 11/23/2018 Delivering MD: Janyth Contes   Date of discharge: 11/24/2018  Admitting diagnosis: pregnancy Intrauterine pregnancy: [redacted]w[redacted]d     Secondary diagnosis:  Principal Problem:   SVD (spontaneous vaginal delivery) Active Problems:   Normal pregnancy in multigravida in third trimester  Additional problems: anemia     Discharge diagnosis: Term Pregnancy Delivered                                                                                                Post partum procedures:none  Augmentation: AROM, Pitocin and Cytotec  Complications: None  Hospital course:  Induction of Labor With Vaginal Delivery   28 y.o. yo [id number] at [redacted]w[redacted]d was admitted to the hospital 11/23/2018 for induction of labor.  Indication for induction: Favorable cervix at term.  Patient had an uncomplicated labor course as follows: Membrane Rupture Time/Date: 8:00 AM ,11/23/2018   Intrapartum Procedures: Episiotomy: None [1]                                         Lacerations:  2nd degree [3];Perineal [11]  Patient had delivery of a Viable infant.  Information for the patient's newborn:  Nattie, Lazenby [[id number]]  Delivery Method: Vaginal, Spontaneous(Filed from Delivery Summary)    11/23/2018  Details of delivery can be found in separate delivery note.  Patient had a routine postpartum course. Patient is discharged home 11/24/18.  Physical exam  Vitals:   11/23/18 1909 11/23/18 2302 11/24/18 0103 11/24/18 0354  BP: (!) 105/55 (!) 101/41 (!) 116/50 (!) 99/49  Pulse: 65 67 (!) 59 63  Resp: 18   19  Temp: 98.6 F (37 C) 98.7 F (37.1 C) 98.7 F (37.1 C) 98.4 F (36.9 C)  TempSrc: Oral Oral Oral Oral  SpO2: 99% 99% 100% 99%  Weight:      Height:       General: alert, cooperative and no distress Lochia: appropriate Uterine Fundus: firm Incision: N/A DVT  Evaluation: No evidence of DVT seen on physical exam. Labs: Lab Results  Component Value Date   WBC 10.5 11/24/2018   HGB 8.4 (L) 11/24/2018   HCT 27.9 (L) 11/24/2018   MCV 81.1 11/24/2018   PLT 225 11/24/2018   No flowsheet data found.  Discharge instruction: per After Visit Summary and "Baby and Me Booklet".  After visit meds:  Allergies as of 11/24/2018   No Known Allergies     Medication List    TAKE these medications   Ferrous Fumarate 63 (20 Fe) MG Tabs Take 1 tablet by mouth every other day.   ibuprofen 600 MG tablet Commonly known as: ADVIL Take 1 tablet (600 mg total) by mouth every 6 (six) hours as needed for cramping.   prenatal multivitamin Tabs tablet Take 1 tablet by mouth daily at 12 noon.       Diet: routine diet  Activity: Advance as tolerated.  Pelvic rest for 6 weeks.   Outpatient follow up:6 weeks Follow up Appt:No future appointments. Follow up Visit:No follow-ups on file.  Postpartum contraception: Not Discussed  Newborn Data: Live born female  Birth Weight: 8 lb 5.7 oz (3790 g) APGAR: 9, 9  Newborn Delivery   Birth date/time: 11/23/2018 11:26:00 Delivery type: Vaginal, Spontaneous      Baby Feeding: Bottle Disposition:home with mother   11/24/2018 Cathrine Musterecilia W , DO

## 2018-11-24 NOTE — Progress Notes (Signed)
Patient ID: Teresa Santana, female   DOB: 1990-11-20, 28 y.o.   MRN: [id number] Pt doing well with no complaints. Ambulating with no lightheadedness. Tol PO. Bonding well with baby/ Denies HA/CP/SOB. Desires discharge to home today VSS GEN - NAD ABD - FF EXT - no homans  10.5>8.4<225  A/P: PPD#1 s/p svd - stable         Routine pp care         Discharge to home later today - instructions reviewed

## 2018-11-24 NOTE — Discharge Instructions (Signed)
Call office with any concerns [phone] 

## 2019-06-25 ENCOUNTER — Ambulatory Visit: Payer: 59 | Attending: Internal Medicine

## 2019-06-25 DIAGNOSIS — Z23 Encounter for immunization: Secondary | ICD-10-CM

## 2019-06-25 NOTE — Progress Notes (Signed)
   Covid-19 Vaccination Clinic  Name:  Teresa Santana    MRN: [id number] DOB: [birth year]  06/25/2019  [name] was observed post Covid-19 immunization for 15 minutes without incident. She was provided with Vaccine Information Sheet and instruction to access the V-Safe system.   [name] was instructed to call 911 with any severe reactions post vaccine: Marland Kitchen Difficulty breathing  . Swelling of face and throat  . A fast heartbeat  . A bad rash all over body  . Dizziness and weakness   Immunizations Administered    Name Date Dose VIS Date Route   Pfizer COVID-19 Vaccine 06/25/2019  9:14 AM 0.3 mL 03/05/2019 Intramuscular   Manufacturer: [employer], [employer]   Lot: [id number]   NDC: [address]-4

## 2019-07-20 ENCOUNTER — Ambulatory Visit: Payer: 59 | Attending: Internal Medicine

## 2019-07-20 DIAGNOSIS — Z23 Encounter for immunization: Secondary | ICD-10-CM

## 2019-07-20 NOTE — Progress Notes (Signed)
   Covid-19 Vaccination Clinic  Name:  Teresa Santana    MRN: [id number] DOB: [birth year]  07/20/2019  [name] was observed post Covid-19 immunization for 15 minutes without incident. She was provided with Vaccine Information Sheet and instruction to access the V-Safe system.   [name] was instructed to call 911 with any severe reactions post vaccine: Marland Kitchen Difficulty breathing  . Swelling of face and throat  . A fast heartbeat  . A bad rash all over body  . Dizziness and weakness   Immunizations Administered    Name Date Dose VIS Date Route   Pfizer COVID-19 Vaccine 07/20/2019  8:55 AM 0.3 mL 05/19/2018 Intramuscular   Manufacturer: [employer], [employer]   Lot: [id number]   NDC: [address]-8

## 2020-06-06 ENCOUNTER — Other Ambulatory Visit (HOSPITAL_COMMUNITY)
Admission: RE | Admit: 2020-06-06 | Discharge: 2020-06-06 | Disposition: A | Discharge status: Home / Self Care | Payer: 59 | Source: Ambulatory Visit | Attending: Family Medicine | Admitting: Family Medicine

## 2020-06-06 ENCOUNTER — Other Ambulatory Visit: Payer: Self-pay | Admitting: Family Medicine

## 2020-06-06 DIAGNOSIS — Z0000 Encounter for general adult medical examination without abnormal findings: Secondary | ICD-10-CM | POA: Diagnosis present

## 2020-06-08 LAB — CYTOLOGY - PAP: Diagnosis: NEGATIVE

## 2021-07-24 DIAGNOSIS — R7401 Elevation of levels of liver transaminase levels: Secondary | ICD-10-CM | POA: Diagnosis not present

## 2021-07-25 ENCOUNTER — Ambulatory Visit (INDEPENDENT_AMBULATORY_CARE_PROVIDER_SITE_OTHER): Payer: BC Managed Care – PPO

## 2021-07-25 ENCOUNTER — Ambulatory Visit
Admission: RE | Admit: 2021-07-25 | Discharge: 2021-07-25 | Disposition: A | Discharge status: Home / Self Care | Payer: BC Managed Care – PPO | Source: Ambulatory Visit

## 2021-07-25 VITALS — BP 105/67 | HR 57 | Temp 97.9°F | Resp 18

## 2021-07-25 DIAGNOSIS — S92424A Nondisplaced fracture of distal phalanx of right great toe, initial encounter for closed fracture: Secondary | ICD-10-CM

## 2021-07-25 NOTE — ED Triage Notes (Signed)
Pt states on Saturday she dropped a cement block on her right  great toe foot. Patient states she is barely able to flex the great toe.  ?

## 2021-07-25 NOTE — ED Provider Notes (Signed)
?[facility] ? ? ? ?CSN: [id number] ?Arrival date & time: 07/25/21  [id number] ?  ? ?HISTORY  ? ?Chief Complaint  ?Patient presents with  ? Toe Injury  ?  Dropped a cement block on my big toe on Saturday. Still extremely sore and I?m wondering if it is broken. - Entered by patient  ? ?HPI ?Teresa Santana is a 31 y.o. female. Patient states she dropped a cement block on the distal aspect of her right great toe 4 days ago, states she is noticed that it is significantly swollen, bruised and she has very limited ability to wiggle it.  Patient that she does not have any strength in it at all.  Patient states she is concerned it may be broken and would like an x-ray today.  Patient states she has not tried any remedies for this, has not splinted it but did try several pairs of shoes to find the most comfortable when to wear. ? ?The history is provided by the patient.  ?Past Medical History:  ?Diagnosis Date  ? Medical history non-contributory   ? Normal pregnancy in multigravida in third trimester 11/22/2018  ? SVD (spontaneous vaginal delivery) 04/04/2017  ? SVD (spontaneous vaginal delivery) 11/23/2018  ? ?Patient Active Problem List  ? Diagnosis Date Noted  ? SVD (spontaneous vaginal delivery) 11/23/2018  ? Normal pregnancy in multigravida in third trimester 11/22/2018  ? ?Past Surgical History:  ?Procedure Laterality Date  ? NO PAST SURGERIES    ? WISDOM TOOTH EXTRACTION    ? ?OB History   ? ? Gravida  ?2  ? Para  ?2  ? Term  ?2  ? Preterm  ?   ? AB  ?   ? Living  ?2  ?  ? ? SAB  ?   ? IAB  ?   ? Ectopic  ?   ? Multiple  ?0  ? Live Births  ?2  ?   ?  ?  ? ?Home Medications   ? ?Prior to Admission medications   ?Medication Sig Start Date End Date Taking? Authorizing Provider  ?Ferrous Fumarate 63 (20 Fe) MG TABS Take 1 tablet by mouth every other day. 11/24/18   Edwinna AreolaBanga, Cecilia Worema, DO  ?ibuprofen (ADVIL) 600 MG tablet Take 1 tablet (600 mg total) by mouth every 6 (six) hours as needed for cramping.  11/24/18   Edwinna AreolaBanga, Cecilia Worema, DO  ?Prenatal Vit-Fe Fumarate-FA (PRENATAL MULTIVITAMIN) TABS tablet Take 1 tablet by mouth daily at 12 noon. 04/05/17   Sherian ReinBovard-Stuckert, Jody, MD  ? ? ?Family History ?Family History  ?Problem Relation Age of Onset  ? Cancer Paternal Grandmother   ?     lung  ? Cancer Mother 1260  ?     Bone or Lung, unsure currently  ? ?Social History ?Social History  ? ?Tobacco Use  ? Smoking status: Never  ? Smokeless tobacco: Never  ?Vaping Use  ? Vaping Use: Never used  ?Substance Use Topics  ? Alcohol use: Not Currently  ? Drug use: No  ? ?Allergies   ?Patient has no known allergies. ? ?Review of Systems ?Review of Systems ?Pertinent findings noted in history of present illness.  ? ?Physical Exam ?Triage Vital Signs ?ED Triage Vitals  ?Enc Vitals Group  ?   BP 01/19/21 0827 (!) 147/82  ?   Pulse Rate 01/19/21 0827 72  ?   Resp 01/19/21 0827 18  ?   Temp 01/19/21 0827 98.3 ?F (36.8 ?C)  ?  Temp Source 01/19/21 0827 Oral  ?   SpO2 01/19/21 0827 98 %  ?   Weight --   ?   Height --   ?   Head Circumference --   ?   Peak Flow --   ?   Pain Score 01/19/21 0826 5  ?   Pain Loc --   ?   Pain Edu? --   ?   Excl. in GC? --   ?No data found. ? ?Updated Vital Signs ?BP 105/67 (BP Location: Left Arm)   Pulse (!) 57   Temp 97.9 ?F (36.6 ?C) (Oral)   Resp 18   LMP 07/18/2021   SpO2 99%  ? ?Physical Exam ?Vitals and nursing note reviewed.  ?Constitutional:   ?   General: She is not in acute distress. ?   Appearance: Normal appearance. She is not ill-appearing.  ?HENT:  ?   Head: Normocephalic and atraumatic.  ?Eyes:  ?   General: Lids are normal.     ?   Right eye: No discharge.     ?   Left eye: No discharge.  ?   Extraocular Movements: Extraocular movements intact.  ?   Conjunctiva/sclera: Conjunctivae normal.  ?   Right eye: Right conjunctiva is not injected.  ?   Left eye: Left conjunctiva is not injected.  ?Neck:  ?   Trachea: Trachea and phonation normal.  ?Cardiovascular:  ?   Rate and Rhythm: Normal  rate and regular rhythm.  ?   Pulses: Normal pulses.  ?   Heart sounds: Normal heart sounds. No murmur heard. ?  No friction rub. No gallop.  ?Pulmonary:  ?   Effort: Pulmonary effort is normal. No accessory muscle usage, prolonged expiration or respiratory distress.  ?   Breath sounds: Normal breath sounds. No stridor, decreased air movement or transmitted upper airway sounds. No decreased breath sounds, wheezing, rhonchi or rales.  ?Chest:  ?   Chest wall: No tenderness.  ?Musculoskeletal:  ?   Cervical back: Normal range of motion and neck supple. Normal range of motion.  ?   Right foot: Decreased range of motion (Right great toe). Normal capillary refill. Swelling (Right great toe with ecchymoses beneath right nailbed), tenderness (Right great toe) and bony tenderness (Right great toe) present. No deformity, bunion, Charcot foot, foot drop, prominent metatarsal heads, laceration or crepitus. Normal pulse.  ?   Left foot: Normal.  ?Lymphadenopathy:  ?   Cervical: No cervical adenopathy.  ?Skin: ?   General: Skin is warm and dry.  ?   Findings: No erythema or rash.  ?Neurological:  ?   General: No focal deficit present.  ?   Mental Status: She is alert and oriented to person, place, and time.  ?Psychiatric:     ?   Mood and Affect: Mood normal.     ?   Behavior: Behavior normal.  ? ? ?Visual Acuity ?Right Eye Distance:   ?Left Eye Distance:   ?Bilateral Distance:   ? ?Right Eye Near:   ?Left Eye Near:    ?Bilateral Near:    ? ?UC Couse / Diagnostics / Procedures:  ?  ?EKG ? ?Radiology ?DG Toe Great Right ? ?Result Date: 07/25/2021 ?CLINICAL DATA:  Dropped concrete block on right great toe 4 days ago. Pain. EXAM: RIGHT GREAT TOE COMPARISON:  None Available. FINDINGS: There is subtle curvilinear lucency within portions of the distal aspect of the distal phalanx of the great toe and also extending through  the proximal aspect, close to the proximal lateral articular surface. On lateral view, there is curvilinear lucency  extending through the dorsal aspect of the base of the distal and proximal thirds of the distal phalanx, as well as the plantar aspect of the distal third of the distal phalanx. Joint spaces are preserved.  No dislocation. IMPRESSION: Nondisplaced acute fracture of the distal and proximal aspects of the distal phalanx of the great toe. This comes close to the far lateral aspect of the proximal articular surface without definitely extending through the articular surface. Electronically Signed   By: Neita Garnet M.D.   On: 07/25/2021 10:06   ? ?Procedures ?Procedures (including critical care time) ? ?UC Diagnoses / Final Clinical Impressions(s)   ?I have reviewed the triage vital signs and the nursing notes. ? ?Pertinent labs & imaging results that were available during my care of the patient were reviewed by me and considered in my medical decision making (see chart for details).   ? ?Final diagnoses:  ?Closed nondisplaced fracture of distal phalanx of right great toe, initial encounter  ? ?Crushing injury of right distal phalanx of great toe.  Patient advised to avoid ambulation is much as possible, wear comfortable shoes follow-up with orthopedics in 5 to 7 days if no better.  NSAIDs for pain as needed.  Patient politely declined offer for crutches. ?ED Prescriptions   ?None ?  ? ?PDMP not reviewed this encounter. ? ?Pending results:  ?Labs Reviewed - No data to display ? ?Medications Ordered in UC: ?Medications - No data to display ? ?Disposition Upon Discharge:  ?Condition: stable for discharge home ?Home: take medications as prescribed; routine discharge instructions as discussed; follow up as advised. ? ?Patient presented with an acute illness with associated systemic symptoms and significant discomfort requiring urgent management. In my opinion, this is a condition that a prudent lay person (someone who possesses an average knowledge of health and medicine) may potentially expect to result in complications if  not addressed urgently such as respiratory distress, impairment of bodily function or dysfunction of bodily organs.  ? ?Routine symptom specific, illness specific and/or disease specific instructions were di

## 2021-07-25 NOTE — Discharge Instructions (Signed)
The very tip of your right great toe is fractured from a crushing injury incurred after inadvertently dropping a concrete block on your toe. ? ?At this time, I do not recommend casting or special boot.  The fracture is not displaced meaning that the bone is still largely in the place where it normally lies.   ? ?That being said, for best healing results, it is important that you stay off of it absolutely as much as you possibly can either by rest or by using crutches or some other device that allows you to be mobile without walking on your right foot.  We are happy to provide you with crutches here in urgent care later today if you change your mind. ? ?If you feel that you are not making progress in the next 5 to 7 days, please go to [facility] at the location during the time specified on the flyer included with this checkout sheet for further evaluation and treatment. ? ?Thank you for visiting urgent care today. ? ? ?

## 2022-02-22 DIAGNOSIS — Z23 Encounter for immunization: Secondary | ICD-10-CM | POA: Diagnosis not present

## 2022-07-16 DIAGNOSIS — Z0000 Encounter for general adult medical examination without abnormal findings: Secondary | ICD-10-CM | POA: Diagnosis not present

## 2022-07-16 DIAGNOSIS — Z13220 Encounter for screening for lipoid disorders: Secondary | ICD-10-CM | POA: Diagnosis not present

## 2022-11-01 DIAGNOSIS — N898 Other specified noninflammatory disorders of vagina: Secondary | ICD-10-CM | POA: Diagnosis not present

## 2022-11-18 DIAGNOSIS — N898 Other specified noninflammatory disorders of vagina: Secondary | ICD-10-CM | POA: Diagnosis not present

## 2022-12-31 DIAGNOSIS — Z23 Encounter for immunization: Secondary | ICD-10-CM | POA: Diagnosis not present

## 2023-02-02 IMAGING — DX DG TOE GREAT 2+V*R*
3 series · 3 of 3 positions shown · non-contrast
Comparison: None Available.

CLINICAL DATA: Dropped concrete block on right great toe 4 days
ago. Pain.

EXAM:
RIGHT GREAT TOE

[toes ap]
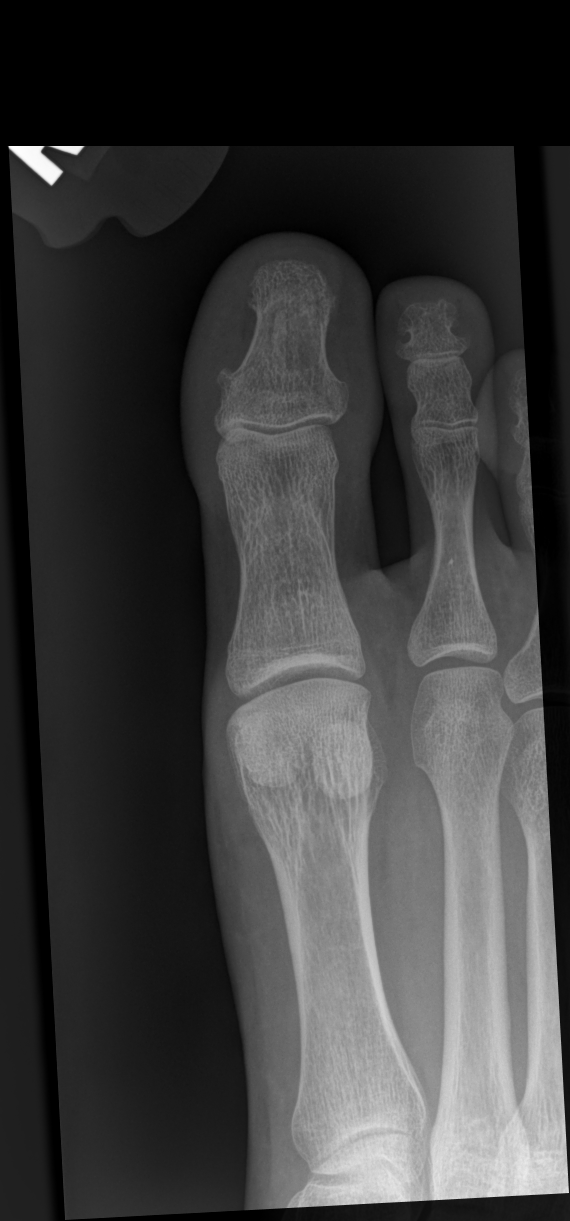

[toes mlo]
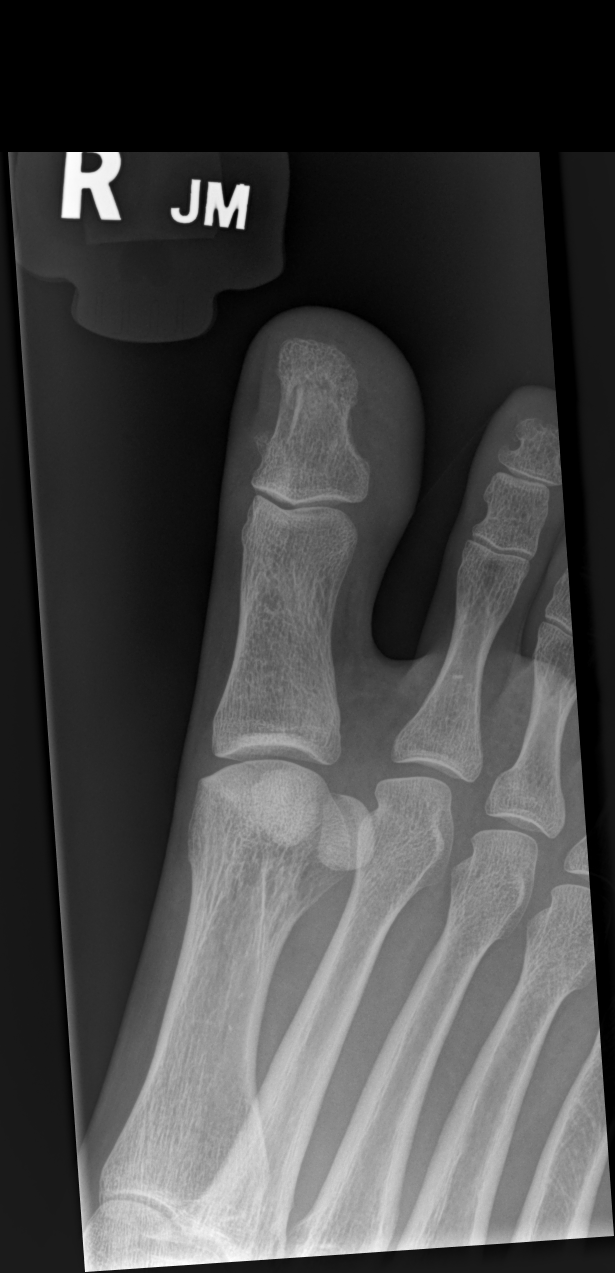

[toes lat]
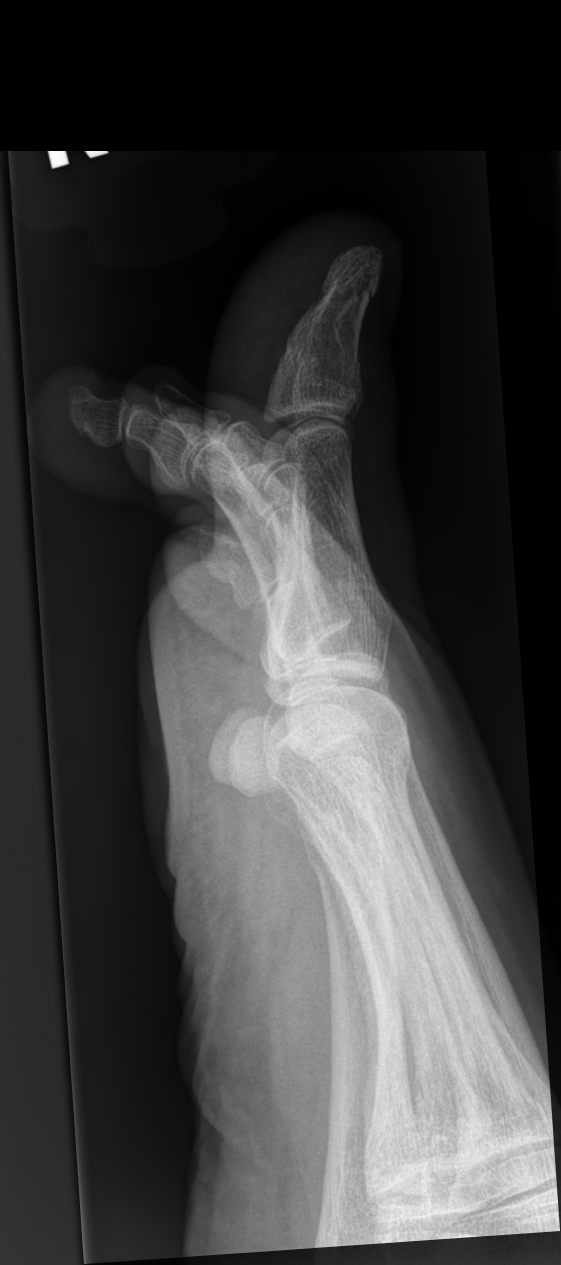

[3 of 3 positions shown; findings below may reference images not displayed]

FINDINGS: There is subtle curvilinear lucency within portions of the distal
aspect of the distal phalanx of the great toe and also extending
through the proximal aspect, close to the proximal lateral articular
surface. On lateral view, there is curvilinear lucency extending
through the dorsal aspect of the base of the distal and proximal
thirds of the distal phalanx, as well as the plantar aspect of the
distal third of the distal phalanx.

Joint spaces are preserved.  No dislocation.
IMPRESSION: Nondisplaced acute fracture of the distal and proximal aspects of
the distal phalanx of the great toe. This comes close to the far
lateral aspect of the proximal articular surface without definitely
extending through the articular surface.

## 2023-05-08 DIAGNOSIS — M25562 Pain in left knee: Secondary | ICD-10-CM | POA: Diagnosis not present

## 2023-07-22 DIAGNOSIS — Z0000 Encounter for general adult medical examination without abnormal findings: Secondary | ICD-10-CM | POA: Diagnosis not present

## 2023-07-22 DIAGNOSIS — Z13220 Encounter for screening for lipoid disorders: Secondary | ICD-10-CM | POA: Diagnosis not present

## 2023-07-25 DIAGNOSIS — Z0000 Encounter for general adult medical examination without abnormal findings: Secondary | ICD-10-CM | POA: Diagnosis not present

## 2023-07-25 DIAGNOSIS — Z124 Encounter for screening for malignant neoplasm of cervix: Secondary | ICD-10-CM | POA: Diagnosis not present

## 2023-08-13 DIAGNOSIS — R1031 Right lower quadrant pain: Secondary | ICD-10-CM | POA: Diagnosis not present

## 2024-01-16 DIAGNOSIS — Z23 Encounter for immunization: Secondary | ICD-10-CM | POA: Diagnosis not present
